# Patient Record
Sex: Male | Born: 1975 | Race: White | Hispanic: No | Marital: Married | Smoking: Never smoker
Health system: Southern US, Community
[De-identification: ages and names within clinical notes are randomized; demographics above are authoritative.]

## PROBLEM LIST (undated history)

## (undated) DIAGNOSIS — E785 Hyperlipidemia, unspecified: Secondary | ICD-10-CM

## (undated) DIAGNOSIS — I10 Essential (primary) hypertension: Secondary | ICD-10-CM

## (undated) HISTORY — DX: Hyperlipidemia, unspecified: E78.5

## (undated) HISTORY — DX: Essential (primary) hypertension: I10

## (undated) HISTORY — PX: TONSILLECTOMY: SUR1361

---

## 2011-10-20 ENCOUNTER — Ambulatory Visit (INDEPENDENT_AMBULATORY_CARE_PROVIDER_SITE_OTHER): Payer: 59 | Admitting: Family Medicine

## 2011-10-20 ENCOUNTER — Encounter: Payer: Self-pay | Admitting: Family Medicine

## 2011-10-20 VITALS — BP 125/83 | HR 80 | Temp 97.9°F | Ht 71.0 in | Wt 230.0 lb

## 2011-10-20 DIAGNOSIS — M25561 Pain in right knee: Secondary | ICD-10-CM | POA: Insufficient documentation

## 2011-10-20 DIAGNOSIS — M25569 Pain in unspecified knee: Secondary | ICD-10-CM

## 2011-10-20 NOTE — Assessment & Plan Note (Signed)
consistent with patellofemoral syndrome, less likely early onset DJD.  No evidence this is a gouty flare based on exam.  Shown home exercise program.  Tylenol, nsaids as needed.  Arch supports may be helpful - discussed OTC ones that would help.  Icing as needed.  Consider x-rays and/or formal PT if not improving as expected.  If he does have mild arthritis on radiographs a cortisone injection would be a consideration (does not typically help with patellofemoral syndrome though).  See instructions for further.

## 2011-10-20 NOTE — Progress Notes (Signed)
  Subjective:    Patient ID: Dean Riley, male    DOB: 07/08/1975, 36 y.o.   MRN: [id number]  PCP: Dr. Alberteen Sam  HPI 36 yo M here for bilateral knee pain.  Patient denies known injury. States for past 2-3 years toward end of day especially has had L > R anterior knee pain. Pain worse with prolonged sitting (then going to get up), stairs. Notices it's worse when playing basketball also. Some swelling at times. Not taking any medications or icing. No catching, locking, giving out. 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  Past Medical History  Diagnosis Date  . Hypertension   . Hyperlipidemia   . Gout   . Vitamin d deficiency     Current Outpatient Prescriptions on File Prior to Visit  Medication Sig Dispense Refill  . gemfibrozil (LOPID) 600 MG tablet Take 600 mg by mouth 2 (two) times daily.      Marland Kitchen losartan (COZAAR) 50 MG tablet Take 50 mg by mouth daily.        History reviewed. No pertinent past surgical history.  Allergies  Allergen Reactions  . Indomethacin   . Lisinopril     History   Social History  . Marital Status: Married    Spouse Name: N/A    Number of Children: N/A  . Years of Education: N/A   Occupational History  . Not on file.   Social History Main Topics  . Smoking status: Never Smoker   . Smokeless tobacco: Not on file  . Alcohol Use: Not on file  . Drug Use: Not on file  . Sexually Active: Not on file   Other Topics Concern  . Not on file   Social History Narrative  . No narrative on file    Family History  Problem Relation Age of Onset  . Diabetes Father   . Heart attack Father   . Hyperlipidemia Father   . Hypertension Father   . Sudden death Neg Hx     BP 125/83  Pulse 80  Temp(Src) 97.9 F (36.6 C) (Oral)  Ht 5\' 11"  (1.803 m)  Wt 230 lb (104.327 kg)  BMI 32.08 kg/m2  Review of Systems See HPI above.    Objective:   Physical Exam Gen: NAD  L knee: No gross deformity, ecchymoses, swelling.  Mild VMO  atrophy, patellar shift with flexion to extension. No TTP. FROM. Negative ant/post drawers. Negative valgus/varus testing. Negative lachmanns. Negative mcmurrays, apleys, patellar apprehension, clarkes. NV intact distally. Hip abduction 5/5 strength Mild pes planus.  R knee: No gross deformity, ecchymoses, swelling.  Mild VMO atrophy, patellar shift with flexion to extension. No TTP. FROM. Negative ant/post drawers. Negative valgus/varus testing. Negative lachmanns. Negative mcmurrays, apleys, patellar apprehension, clarkes. NV intact distally. Hip abduction 5/5 strength Mild pes planus.     Assessment & Plan:  1. Bilateral knee pain - consistent with patellofemoral syndrome, less likely early onset DJD.  No evidence this is a gouty flare based on exam.  Shown home exercise program.  Tylenol, nsaids as needed.  Arch supports may be helpful - discussed OTC ones that would help.  Icing as needed.  Consider x-rays and/or formal PT if not improving as expected.  If he does have mild arthritis on radiographs a cortisone injection would be a consideration (does not typically help with patellofemoral syndrome though).  See instructions for further.

## 2011-10-20 NOTE — Patient Instructions (Signed)
Your history and exam are most consistent with patellofemoral syndrome. Avoid painful activities (especially squats and lunges, plyometrics, increasing running mileage) as much as possible. Cross train with swimming, cycling with low resistance, elliptical Straight leg raise, hip abduction, hip adduction/VMO exercises at least once daily - 3 sets of 15 - can add ankle weight if these become too easy. Consider formal physical therapy Correct foot breakdown with OTC orthotics. Icing 15 minutes at a time 3-4 times a day Tylenol (500mg  1-2 tabs three times a day) or aleve (2 tabs twice a day with food) as needed for pain If not improving as expected after 6 weeks, come back to see me (or call and I can put in order for x-rays).

## 2011-11-06 ENCOUNTER — Encounter: Payer: Self-pay | Admitting: Internal Medicine

## 2011-11-06 ENCOUNTER — Ambulatory Visit (INDEPENDENT_AMBULATORY_CARE_PROVIDER_SITE_OTHER): Payer: 59 | Admitting: Internal Medicine

## 2011-11-06 VITALS — BP 108/82 | HR 87 | Temp 98.1°F | Resp 18 | Ht 70.5 in | Wt 229.2 lb

## 2011-11-06 DIAGNOSIS — M25569 Pain in unspecified knee: Secondary | ICD-10-CM

## 2011-11-06 DIAGNOSIS — E785 Hyperlipidemia, unspecified: Secondary | ICD-10-CM

## 2011-11-06 DIAGNOSIS — M25562 Pain in left knee: Secondary | ICD-10-CM

## 2011-11-06 DIAGNOSIS — I10 Essential (primary) hypertension: Secondary | ICD-10-CM

## 2011-11-06 DIAGNOSIS — R454 Irritability and anger: Secondary | ICD-10-CM

## 2011-11-06 DIAGNOSIS — R4589 Other symptoms and signs involving emotional state: Secondary | ICD-10-CM

## 2011-11-06 MED ORDER — FENOFIBRATE 145 MG PO TABS: 145.0000 mg | ORAL_TABLET | Freq: Every day | ORAL | Status: DC

## 2011-11-06 MED ORDER — DICLOFENAC SODIUM 75 MG PO TBEC: 75.0000 mg | DELAYED_RELEASE_TABLET | Freq: Two times a day (BID) | ORAL | Status: AC | PRN

## 2011-11-06 MED ORDER — SCOPOLAMINE 1 MG/3DAYS TD PT72: 1.0000 | MEDICATED_PATCH | TRANSDERMAL | Status: AC

## 2011-11-06 MED ORDER — LOSARTAN POTASSIUM 50 MG PO TABS: 50.0000 mg | ORAL_TABLET | Freq: Every day | ORAL | Status: DC

## 2011-11-06 NOTE — Patient Instructions (Signed)
Please schedule fasting labs in 6wks- lipid/lft 272.4, uric acid- gout, cbc-leukocytosis

## 2011-11-12 DIAGNOSIS — I10 Essential (primary) hypertension: Secondary | ICD-10-CM | POA: Insufficient documentation

## 2011-11-12 DIAGNOSIS — E785 Hyperlipidemia, unspecified: Secondary | ICD-10-CM | POA: Insufficient documentation

## 2011-11-12 DIAGNOSIS — R454 Irritability and anger: Secondary | ICD-10-CM | POA: Insufficient documentation

## 2011-11-12 NOTE — Progress Notes (Signed)
  Subjective:    Patient ID: Dean Riley, male    DOB: 1975/11/19, 36 y.o.   MRN: [id number]  HPI Pt presents to clinic for evaluation of multiple medical problems. Reviewed recent outside chol with tg under suboptimal control. Has difficulty with lopid bid dosing. Notes quick temper but no repercusions. Planning on taking a cruise in near future and requests sea sickness medication. Has chronic intermittent knee pain.   Past Medical History  Diagnosis Date  . Hypertension   . Hyperlipidemia   . Gout   . Vitamin d deficiency    No past surgical history on file.  reports that he has never smoked. He does not have any smokeless tobacco history on file. His alcohol and drug histories not on file. family history includes Diabetes in his father; Heart attack in his father; Hyperlipidemia in his father; and Hypertension in his father.  There is no history of Sudden death. Allergies  Allergen Reactions  . Indomethacin   . Lisinopril      Review of Systems  Musculoskeletal: Positive for arthralgias.  Psychiatric/Behavioral: Negative for behavioral problems and agitation.  All other systems reviewed and are negative.       Objective:   Physical Exam  Nursing note and vitals reviewed. Constitutional: He appears well-developed and well-nourished. No distress.  HENT:  Head: Normocephalic and atraumatic.  Eyes: Conjunctivae are normal. No scleral icterus.  Neck: Neck supple.  Cardiovascular: Normal rate, regular rhythm and normal heart sounds.  Exam reveals no gallop and no friction rub.   No murmur heard. Pulmonary/Chest: Effort normal and breath sounds normal. No respiratory distress. He has no wheezes. He has no rales.  Neurological: He is alert.  Skin: Skin is warm and dry. He is not diaphoretic.  Psychiatric: He has a normal mood and affect.          Assessment & Plan:

## 2011-11-12 NOTE — Assessment & Plan Note (Signed)
Normotensive and stable. Continue current regimen. Monitor bp as outpt and followup in clinic as scheduled.  

## 2011-11-12 NOTE — Assessment & Plan Note (Signed)
Will attempt regular exercise. Discussed counseling. States will call if wishes to proceed

## 2011-11-12 NOTE — Assessment & Plan Note (Signed)
Attempt voltaren prn with food and no other nsaids.

## 2011-11-12 NOTE — Assessment & Plan Note (Signed)
Change lopid to fenofibrate. Obtain lipid/lft 6wks

## 2011-11-23 ENCOUNTER — Encounter: Payer: Self-pay | Admitting: Internal Medicine

## 2011-12-12 ENCOUNTER — Telehealth: Payer: Self-pay | Admitting: Internal Medicine

## 2011-12-12 DIAGNOSIS — E785 Hyperlipidemia, unspecified: Secondary | ICD-10-CM

## 2011-12-12 DIAGNOSIS — M109 Gout, unspecified: Secondary | ICD-10-CM

## 2011-12-12 DIAGNOSIS — D72829 Elevated white blood cell count, unspecified: Secondary | ICD-10-CM

## 2011-12-12 NOTE — Telephone Encounter (Signed)
Allopurinol Last OV 11-06-11, not on med list.Please advise    Lab orders placed

## 2011-12-12 NOTE — Telephone Encounter (Signed)
Please schedule fasting labs for July --- lipid/lft 272.4, uric acid- gout, cbc-leukocytosis. Patients wife states that he will be going to the [facility] lab.

## 2011-12-12 NOTE — Telephone Encounter (Signed)
Patients wife Lynnea Ferrier called back stating that patient will be leaving for vacation this Friday and will do labs when he returns from vacation.  Also, Lynnea Ferrier states that patient is almost out of allopurinol and would like a refill.(received fax from [facility]).   --Allopurinol 100mg  tab. Take one tablet by mouth every day for two weeks then two tablets daily for one week then three tabs daily for one week. Qty 49 last fill 5.20.13. Dr. Alberteen Sam previously wrote rx.   Call Kerri(patients wife) if we have any questions. She works for Dr. Alberteen Sam-- [phone]

## 2011-12-13 NOTE — Telephone Encounter (Signed)
Ok to fill it for 3 months but need to confirm dose since we don't have it on med list. prob 300mg  qd

## 2011-12-14 MED ORDER — ALLOPURINOL 100 MG PO TABS: 200.0000 mg | ORAL_TABLET | Freq: Every day | ORAL | Status: DC

## 2011-12-14 NOTE — Telephone Encounter (Signed)
Spoke with pt's wife and verified that pt currently takes 200mg  daily and symptoms seem to be under control. Advised her refill will be sent for 200mg .

## 2011-12-18 ENCOUNTER — Encounter: Payer: Self-pay | Admitting: Internal Medicine

## 2012-01-15 LAB — CBC
HCT: 46 % (ref 39.0–52.0)
MCH: 29.8 pg (ref 26.0–34.0)
MCHC: 33.9 g/dL (ref 30.0–36.0)
RDW: 14.1 % (ref 11.5–15.5)

## 2012-01-15 LAB — LIPID PANEL
Cholesterol: 204 mg/dL — ABNORMAL HIGH (ref 0–200)
HDL: 35 mg/dL — ABNORMAL LOW (ref >39)
Total CHOL/HDL Ratio: 5.8 Ratio

## 2012-01-15 NOTE — Addendum Note (Signed)
Addended byDuaine Dredge, Emmilyn Crooke L on: 01/15/2012 10:09 AM   Modules accepted: Orders

## 2012-01-16 LAB — HEPATIC FUNCTION PANEL
ALT: 58 U/L — ABNORMAL HIGH (ref 0–53)
AST: 27 U/L (ref 0–37)
Albumin: 4.7 g/dL (ref 3.5–5.2)
Alkaline Phosphatase: 72 U/L (ref 39–117)
Bilirubin, Direct: 0.1 mg/dL (ref 0.0–0.3)

## 2012-01-16 LAB — URIC ACID: Uric Acid, Serum: 5.5 mg/dL (ref 4.0–7.8)

## 2012-02-06 ENCOUNTER — Ambulatory Visit: Payer: 59 | Admitting: Internal Medicine

## 2012-02-12 ENCOUNTER — Encounter: Payer: Self-pay | Admitting: Internal Medicine

## 2012-02-12 ENCOUNTER — Telehealth: Payer: Self-pay | Admitting: *Deleted

## 2012-02-12 ENCOUNTER — Ambulatory Visit (INDEPENDENT_AMBULATORY_CARE_PROVIDER_SITE_OTHER): Payer: 59 | Admitting: Internal Medicine

## 2012-02-12 VITALS — BP 98/68 | HR 70 | Temp 98.4°F | Resp 16 | Wt 229.8 lb

## 2012-02-12 DIAGNOSIS — R7989 Other specified abnormal findings of blood chemistry: Secondary | ICD-10-CM

## 2012-02-12 DIAGNOSIS — I10 Essential (primary) hypertension: Secondary | ICD-10-CM

## 2012-02-12 DIAGNOSIS — Z79899 Other long term (current) drug therapy: Secondary | ICD-10-CM

## 2012-02-12 DIAGNOSIS — M109 Gout, unspecified: Secondary | ICD-10-CM

## 2012-02-12 DIAGNOSIS — R945 Abnormal results of liver function studies: Secondary | ICD-10-CM

## 2012-02-12 DIAGNOSIS — E785 Hyperlipidemia, unspecified: Secondary | ICD-10-CM

## 2012-02-12 DIAGNOSIS — E559 Vitamin D deficiency, unspecified: Secondary | ICD-10-CM

## 2012-02-12 MED ORDER — ALLOPURINOL 300 MG PO TABS: 300.0000 mg | ORAL_TABLET | Freq: Every day | ORAL | Status: DC

## 2012-02-12 NOTE — Telephone Encounter (Signed)
Please schedule nonfasting labs in ~6weeks (uric acid-gout, lft-abn lft, vit d-vit d deficiency)  Also please schedule fasting labs prior to next appointment lipid/lft-272.4 and chem7-v58.69

## 2012-02-12 NOTE — Patient Instructions (Signed)
Please schedule nonfasting labs in ~6weeks (uric acid-gout, lft-abn lft, vit d-vit d deficiency)  Also please schedule fasting labs prior to next appointment lipid/lft-272.4 and chem7-v58.69   

## 2012-02-22 DIAGNOSIS — M109 Gout, unspecified: Secondary | ICD-10-CM | POA: Insufficient documentation

## 2012-02-22 DIAGNOSIS — R945 Abnormal results of liver function studies: Secondary | ICD-10-CM | POA: Insufficient documentation

## 2012-02-22 DIAGNOSIS — R7989 Other specified abnormal findings of blood chemistry: Secondary | ICD-10-CM | POA: Insufficient documentation

## 2012-02-22 NOTE — Assessment & Plan Note (Signed)
Stable. Continue fenofibrate. Recommend low-fat diet exercise and weight loss.

## 2012-02-22 NOTE — Assessment & Plan Note (Signed)
Low normal. Asymptomatic. recommend outpatient blood pressure log to be reviewed.

## 2012-02-22 NOTE — Assessment & Plan Note (Signed)
Asymptomatic. Obtain liver function tests prior to next visit.

## 2012-02-22 NOTE — Assessment & Plan Note (Signed)
Off allopurinol. Obtain uric acid level prior to next visit

## 2012-02-22 NOTE — Progress Notes (Signed)
  Subjective:    Patient ID: Dean Riley, male    DOB: Mar 18, 1976, 36 y.o.   MRN: [id number]  HPI Pt presents to clinic for followup of multiple medical problems. Blood pressure low normal without dizziness or syncope. Compliant with medication without adverse effect. Off allopurinol with no recent gout attack. Tolerating fenofibrate. Reviewed mildly elevated triglycerides and depressed HDL. Has minimally elevated ALT.  Past Medical History  Diagnosis Date  . Hypertension   . Hyperlipidemia   . Gout   . Vitamin d deficiency    No past surgical history on file.  reports that he has never smoked. He does not have any smokeless tobacco history on file. His alcohol and drug histories not on file. family history includes Diabetes in his father; Heart attack in his father; Hyperlipidemia in his father; and Hypertension in his father.  There is no history of Sudden death. Allergies  Allergen Reactions  . Indomethacin   . Lisinopril       Review of Systems see hpi     Objective:   Physical Exam  Physical Exam  Nursing note and vitals reviewed. Constitutional: Appears well-developed and well-nourished. No distress.  HENT:  Head: Normocephalic and atraumatic.  Right Ear: External ear normal.  Left Ear: External ear normal.  Eyes: Conjunctivae are normal. No scleral icterus.  Neck: Neck supple. Carotid bruit is not present.  Cardiovascular: Normal rate, regular rhythm and normal heart sounds.  Exam reveals no gallop and no friction rub.   No murmur heard. Pulmonary/Chest: Effort normal and breath sounds normal. No respiratory distress. He has no wheezes. no rales.  Lymphadenopathy:    He has no cervical adenopathy.  Neurological:Alert.  Skin: Skin is warm and dry. Not diaphoretic.  Psychiatric: Has a normal mood and affect.        Assessment & Plan:

## 2012-03-20 ENCOUNTER — Telehealth: Payer: Self-pay | Admitting: Internal Medicine

## 2012-03-20 NOTE — Telephone Encounter (Signed)
Patients wife Lynnea Ferrier walked in stating that patient called her stating that his blood pressure was 160/90(11:22 am). BP was taken two hours after his "temper flare-up". Patient would like to know if he should take 1/2 bp pill whenever his blood pressure goes up?   Best # to call is   Wife, Lynnea Ferrier [phone]

## 2012-03-20 NOTE — Telephone Encounter (Signed)
Per Vo TWH: Caller informed that we should only be concerned about treating BP if it remains elevated on a consistent basis and/or pt begins to present w/HTN symptoms [lightheaded, dizzy, blurred vision, SOB, weakness, pain in chest and/or arm]; informed caller to have pt focus on calming methods for anxiety and/or "temper flare-ups", whatever is effective for him [reading, praying, walking, music-everyone is different] when these times arise. Caller understood & agreed/SLS

## 2012-06-13 ENCOUNTER — Ambulatory Visit (INDEPENDENT_AMBULATORY_CARE_PROVIDER_SITE_OTHER): Payer: 59 | Admitting: Internal Medicine

## 2012-06-13 ENCOUNTER — Encounter: Payer: Self-pay | Admitting: Internal Medicine

## 2012-06-13 VITALS — BP 108/74 | HR 82 | Temp 98.2°F | Resp 16 | Wt 233.0 lb

## 2012-06-13 DIAGNOSIS — I10 Essential (primary) hypertension: Secondary | ICD-10-CM

## 2012-06-13 DIAGNOSIS — E559 Vitamin D deficiency, unspecified: Secondary | ICD-10-CM

## 2012-06-13 DIAGNOSIS — E785 Hyperlipidemia, unspecified: Secondary | ICD-10-CM

## 2012-06-13 DIAGNOSIS — M109 Gout, unspecified: Secondary | ICD-10-CM

## 2012-06-13 NOTE — Telephone Encounter (Signed)
Orders placed; pt here for OV, no prior labs done/SLS

## 2012-06-14 LAB — LIPID PANEL
HDL: 32 mg/dL — ABNORMAL LOW (ref >39)
LDL Cholesterol: 128 mg/dL — ABNORMAL HIGH (ref 0–99)
Total CHOL/HDL Ratio: 7 Ratio

## 2012-06-14 LAB — URIC ACID: Uric Acid, Serum: 8.1 mg/dL — ABNORMAL HIGH (ref 4.0–7.8)

## 2012-06-14 LAB — HEPATIC FUNCTION PANEL
Bilirubin, Direct: 0.2 mg/dL (ref 0.0–0.3)
Indirect Bilirubin: 1 mg/dL — ABNORMAL HIGH (ref 0.0–0.9)
Total Bilirubin: 1.2 mg/dL (ref 0.3–1.2)

## 2012-06-14 LAB — BASIC METABOLIC PANEL
Calcium: 9.7 mg/dL (ref 8.4–10.5)
Potassium: 4.8 mEq/L (ref 3.5–5.3)
Sodium: 140 mEq/L (ref 135–145)

## 2012-06-14 NOTE — Addendum Note (Signed)
Addended by: Regis Bill on: 06/14/2012 08:28 AM   Modules accepted: Orders

## 2012-06-14 NOTE — Telephone Encounter (Signed)
Lab orders released/SLS 

## 2012-06-15 LAB — VITAMIN D 25 HYDROXY (VIT D DEFICIENCY, FRACTURES): Vit D, 25-Hydroxy: 33 ng/mL (ref 30–89)

## 2012-06-16 DIAGNOSIS — E559 Vitamin D deficiency, unspecified: Secondary | ICD-10-CM | POA: Insufficient documentation

## 2012-06-16 NOTE — Assessment & Plan Note (Signed)
Obtain vit d level 

## 2012-06-16 NOTE — Progress Notes (Signed)
  Subjective:    Patient ID: Dean Riley, male    DOB: Aug 02, 1975, 37 y.o.   MRN: [id number]  HPI Pt presents to clinic for followup of multiple medical problems. No recent gout attack. Tolerating medication without adverse effect. Received flu vaccine for the season. No active complaint.  Past Medical History  Diagnosis Date  . Hypertension   . Hyperlipidemia   . Gout   . Vitamin D deficiency    No past surgical history on file.  reports that he has never smoked. He does not have any smokeless tobacco history on file. His alcohol and drug histories not on file. family history includes Diabetes in his father; Heart attack in his father; Hyperlipidemia in his father; and Hypertension in his father.  There is no history of Sudden death. Allergies  Allergen Reactions  . Indomethacin   . Lisinopril       Review of Systems see hpi     Objective:   Physical Exam  Nursing note and vitals reviewed. Constitutional: He appears well-developed and well-nourished. No distress.  Skin: He is not diaphoretic.          Assessment & Plan:

## 2012-06-16 NOTE — Assessment & Plan Note (Signed)
Obtain uric acid

## 2012-06-16 NOTE — Assessment & Plan Note (Signed)
Normotensive and stable. Continue current regimen. Monitor bp as outpt and followup in clinic as scheduled.  

## 2012-06-16 NOTE — Assessment & Plan Note (Signed)
Obtain lipid/lft. 

## 2012-06-21 ENCOUNTER — Other Ambulatory Visit: Payer: Self-pay | Admitting: Internal Medicine

## 2012-06-21 ENCOUNTER — Telehealth: Payer: Self-pay | Admitting: *Deleted

## 2012-06-21 DIAGNOSIS — R945 Abnormal results of liver function studies: Secondary | ICD-10-CM

## 2012-06-21 NOTE — Telephone Encounter (Signed)
Message copied by Regis Bill on Fri Jun 21, 2012  5:14 PM ------      Message from: Edwyna Perfect      Created: Thu Jun 20, 2012  9:35 PM       Chol and TG are high. Low fat diet , exercise and wt loss. Liver test are elevated. Recommend scheduling abd [place] dx-abn lft and repeat lft in one month dx abn lft

## 2012-06-21 NOTE — Telephone Encounter (Signed)
Patient informed, understood & agreed; future lab order placed for LFT/SLS Ok per patient to order Abdominal US. Thanks.

## 2012-07-05 ENCOUNTER — Other Ambulatory Visit (HOSPITAL_BASED_OUTPATIENT_CLINIC_OR_DEPARTMENT_OTHER): Payer: 59

## 2012-07-10 ENCOUNTER — Other Ambulatory Visit (HOSPITAL_BASED_OUTPATIENT_CLINIC_OR_DEPARTMENT_OTHER): Payer: Self-pay | Admitting: Family Medicine

## 2012-07-10 ENCOUNTER — Ambulatory Visit (HOSPITAL_BASED_OUTPATIENT_CLINIC_OR_DEPARTMENT_OTHER)
Admission: RE | Admit: 2012-07-10 | Discharge: 2012-07-10 | Disposition: A | Discharge status: Home / Self Care | Payer: 59 | Source: Ambulatory Visit | Attending: Family Medicine | Admitting: Family Medicine

## 2012-07-10 DIAGNOSIS — R1084 Generalized abdominal pain: Secondary | ICD-10-CM

## 2012-07-10 DIAGNOSIS — R109 Unspecified abdominal pain: Secondary | ICD-10-CM | POA: Insufficient documentation

## 2012-07-10 LAB — CBC WITH DIFFERENTIAL/PLATELET
Basophils Absolute: 0 10*3/uL (ref 0.0–0.1)
Basophils Relative: 0 % (ref 0–1)
Eosinophils Absolute: 0.2 10*3/uL (ref 0.0–0.7)
Eosinophils Relative: 3 % (ref 0–5)
HCT: 46.6 % (ref 39.0–52.0)
Hemoglobin: 16 g/dL (ref 13.0–17.0)
Lymphocytes Relative: 30 % (ref 12–46)
Lymphs Abs: 2.4 10*3/uL (ref 0.7–4.0)
MCH: 30.3 pg (ref 26.0–34.0)
MCHC: 34.3 g/dL (ref 30.0–36.0)
MCV: 88.3 fL (ref 78.0–100.0)
Monocytes Absolute: 0.9 10*3/uL (ref 0.1–1.0)
Monocytes Relative: 11 % (ref 3–12)
Neutro Abs: 4.6 10*3/uL (ref 1.7–7.7)
Neutrophils Relative %: 56 % (ref 43–77)
Platelets: 264 10*3/uL (ref 150–400)
RBC: 5.28 MIL/uL (ref 4.22–5.81)
RDW: 13.1 % (ref 11.5–15.5)
WBC: 8.1 10*3/uL (ref 4.0–10.5)

## 2012-07-22 ENCOUNTER — Other Ambulatory Visit: Payer: Self-pay | Admitting: Internal Medicine

## 2012-10-11 ENCOUNTER — Ambulatory Visit: Payer: 59 | Admitting: Internal Medicine

## 2012-11-22 ENCOUNTER — Other Ambulatory Visit: Payer: 59

## 2012-11-22 LAB — COMPLETE METABOLIC PANEL WITH GFR
ALT: 45 U/L (ref 0–53)
AST: 25 U/L (ref 0–37)
Albumin: 4.4 g/dL (ref 3.5–5.2)
Alkaline Phosphatase: 66 U/L (ref 39–117)
BUN: 14 mg/dL (ref 6–23)
CO2: 27 mEq/L (ref 19–32)
Calcium: 9.7 mg/dL (ref 8.4–10.5)
Chloride: 107 mEq/L (ref 96–112)
Creat: 1.32 mg/dL (ref 0.50–1.35)
GFR, Est African American: 80 mL/min
GFR, Est Non African American: 69 mL/min
Glucose, Bld: 91 mg/dL (ref 70–99)
Potassium: 4.9 mEq/L (ref 3.5–5.3)
Sodium: 143 mEq/L (ref 135–145)
Total Bilirubin: 0.8 mg/dL (ref 0.3–1.2)
Total Protein: 7 g/dL (ref 6.0–8.3)

## 2012-11-22 LAB — LIPID PANEL
Cholesterol: 221 mg/dL — ABNORMAL HIGH (ref 0–200)
HDL: 38 mg/dL — ABNORMAL LOW (ref >39)
LDL Cholesterol: 137 mg/dL — ABNORMAL HIGH (ref 0–99)
Total CHOL/HDL Ratio: 5.8 Ratio
Triglycerides: 231 mg/dL — ABNORMAL HIGH (ref <150)
VLDL: 46 mg/dL — ABNORMAL HIGH (ref 0–40)

## 2012-11-22 LAB — CBC WITH DIFFERENTIAL/PLATELET
Basophils Absolute: 0 10*3/uL (ref 0.0–0.1)
Basophils Relative: 1 % (ref 0–1)
Eosinophils Absolute: 0.2 10*3/uL (ref 0.0–0.7)
Eosinophils Relative: 3 % (ref 0–5)
HCT: 44.6 % (ref 39.0–52.0)
Hemoglobin: 15.5 g/dL (ref 13.0–17.0)
Lymphocytes Relative: 27 % (ref 12–46)
Lymphs Abs: 1.8 10*3/uL (ref 0.7–4.0)
MCH: 29.9 pg (ref 26.0–34.0)
MCHC: 34.8 g/dL (ref 30.0–36.0)
MCV: 85.9 fL (ref 78.0–100.0)
Monocytes Absolute: 0.5 10*3/uL (ref 0.1–1.0)
Monocytes Relative: 8 % (ref 3–12)
Neutro Abs: 4 10*3/uL (ref 1.7–7.7)
Neutrophils Relative %: 61 % (ref 43–77)
Platelets: 270 10*3/uL (ref 150–400)
RBC: 5.19 MIL/uL (ref 4.22–5.81)
RDW: 12.8 % (ref 11.5–15.5)
WBC: 6.5 10*3/uL (ref 4.0–10.5)

## 2012-11-22 LAB — TSH: TSH: 0.957 u[IU]/mL (ref 0.350–4.500)

## 2012-12-02 ENCOUNTER — Ambulatory Visit: Payer: 59 | Admitting: Family Medicine

## 2012-12-05 ENCOUNTER — Ambulatory Visit (HOSPITAL_BASED_OUTPATIENT_CLINIC_OR_DEPARTMENT_OTHER)
Admission: RE | Admit: 2012-12-05 | Discharge: 2012-12-05 | Disposition: A | Discharge status: Home / Self Care | Payer: 59 | Source: Ambulatory Visit | Attending: Family Medicine | Admitting: Family Medicine

## 2012-12-05 ENCOUNTER — Ambulatory Visit: Payer: 59 | Admitting: Family Medicine

## 2012-12-05 ENCOUNTER — Encounter: Payer: Self-pay | Admitting: Family Medicine

## 2012-12-05 ENCOUNTER — Ambulatory Visit (INDEPENDENT_AMBULATORY_CARE_PROVIDER_SITE_OTHER): Payer: 59 | Admitting: Family Medicine

## 2012-12-05 VITALS — BP 127/84 | HR 93 | Wt 236.0 lb

## 2012-12-05 DIAGNOSIS — R079 Chest pain, unspecified: Secondary | ICD-10-CM | POA: Insufficient documentation

## 2012-12-05 DIAGNOSIS — R0781 Pleurodynia: Secondary | ICD-10-CM

## 2012-12-05 DIAGNOSIS — W19XXXA Unspecified fall, initial encounter: Secondary | ICD-10-CM | POA: Insufficient documentation

## 2012-12-05 DIAGNOSIS — R0602 Shortness of breath: Secondary | ICD-10-CM | POA: Insufficient documentation

## 2012-12-05 MED ORDER — TRAMADOL HCL 50 MG PO TABS: 50.0000 mg | ORAL_TABLET | Freq: Four times a day (QID) | ORAL | Status: DC | PRN

## 2012-12-05 MED ORDER — CYCLOBENZAPRINE HCL 10 MG PO TABS: 10.0000 mg | ORAL_TABLET | Freq: Three times a day (TID) | ORAL | Status: DC | PRN

## 2012-12-05 MED ORDER — ACETAMINOPHEN-CODEINE 300-30 MG PO TABS: 1.0000 | ORAL_TABLET | Freq: Four times a day (QID) | ORAL | Status: DC | PRN

## 2012-12-05 NOTE — Patient Instructions (Addendum)
1)  Rib/Back/Flank Pain -  Tramadol - Start with 1 tab at night and you can increase slowly to 4 per day if needed for pain.    Tylenol w/Codeine - Start with 1 and increase as tolerated.  Voltaren Gel  Therma Care - Wear up to 8 hours during the day.    Muscle Relaxer - Flexeril - Take up to 3 times per day.    Rib Contusion A rib contusion (bruise) can occur by a blow to the chest or by a fall against a hard object. Usually these will be much better in a couple weeks. If X-rays were taken today and there are no broken bones (fractures), the diagnosis of bruising is made. However, broken ribs may not show up for several days, or may be discovered later on a routine X-ray when signs of healing show up. If this happens to you, it does not mean that something was missed on the X-ray, but simply that it did not show up on the first X-rays. Earlier diagnosis will not usually change the treatment. HOME CARE INSTRUCTIONS   Avoid strenuous activity. Be careful during activities and avoid bumping the injured ribs. Activities that pull on the injured ribs and cause pain should be avoided, if possible.  For the first day or two, an ice pack used every 20 minutes while awake may be helpful. Put ice in a plastic bag and put a towel between the bag and the skin.  Eat a normal, well-balanced diet. Drink plenty of fluids to avoid constipation.  Take deep breaths several times a day to keep lungs free of infection. Try to cough several times a day. Splint the injured area with a pillow while coughing to ease pain. Coughing can help prevent pneumonia.  Wear a rib belt or binder only if told to do so by your caregiver. If you are wearing a rib belt or binder, you must do the breathing exercises as directed by your caregiver. If not used properly, rib belts or binders restrict breathing which can lead to pneumonia.  Only take over-the-counter or prescription medicines for pain, discomfort, or fever as  directed by your caregiver. SEEK MEDICAL CARE IF:   You or your child has an oral temperature above 102 F (38.9 C).  Your baby is older than 3 months with a rectal temperature of 100.5 F (38.1 C) or higher for more than 1 day.  You develop a cough, with thick or bloody sputum. SEEK IMMEDIATE MEDICAL CARE IF:   You have difficulty breathing.  You feel sick to your stomach (nausea), have vomiting or belly (abdominal) pain.  You have worsening pain, not controlled with medications, or there is a change in the location of the pain.  You develop sweating or radiation of the pain into the arms, jaw or shoulders, or become light headed or faint.  You or your child has an oral temperature above 102 F (38.9 C), not controlled by medicine.  Your or your baby is older than 3 months with a rectal temperature of 102 F (38.9 C) or higher.  Your baby is 44 months old or younger with a rectal temperature of 100.4 F (38 C) or higher. MAKE SURE YOU:   Understand these instructions.  Will watch your condition.  Will get help right away if you are not doing well or get worse. Document Released: 02/14/2001 Document Revised: 08/14/2011 Document Reviewed: 01/08/2008 [facility] Patient Information 2014 [place], [place].

## 2012-12-05 NOTE — Progress Notes (Signed)
  Subjective:    Patient ID: Dean Riley, male    DOB: [birth year], 37 y.o.   MRN: [id number]  HPI  Dean Riley is here today complaining of right sided back pain. He fell and hurt himself about 3 days ago. He describes his pain as severe and feels that it is worsening. He has taken Ibuprofen for the pain which has not helped very much.    Review of Systems  Constitutional: Negative for fever and fatigue.  Respiratory: Negative for shortness of breath.   Cardiovascular: Negative for chest pain.  Musculoskeletal: Positive for back pain.    Past Medical History  Diagnosis Date  . Hypertension   . Hyperlipidemia   . Gout   . Vitamin D deficiency     Family History  Problem Relation Age of Onset  . Diabetes Father   . Heart attack Father   . Hyperlipidemia Father   . Hypertension Father   . Sudden death Neg Hx     History   Social History Narrative   Marital Status: Married Scientific laboratory technician)   Children:  [name], Freer, [name] and [name]   Pets: None   Living Situation: Lives with wife, [name] and [name]   Occupation: Merchandiser, retail at [employer]   Education: GED   Tobacco Use/Exposure:  None    Alcohol Use:  Occasional   Drug Use:  None   Diet:  Regular   Exercise:  None       Objective:   Physical Exam  Constitutional: He appears well-nourished. No distress.  Cardiovascular: Normal rate and regular rhythm.   Pulmonary/Chest: Effort normal and breath sounds normal.  Musculoskeletal: He exhibits tenderness.  Skin: No rash noted.          Assessment & Plan:

## 2012-12-10 ENCOUNTER — Ambulatory Visit: Payer: 59 | Admitting: Family Medicine

## 2012-12-16 ENCOUNTER — Other Ambulatory Visit: Payer: Self-pay | Admitting: Family Medicine

## 2012-12-27 ENCOUNTER — Ambulatory Visit (INDEPENDENT_AMBULATORY_CARE_PROVIDER_SITE_OTHER): Payer: 59 | Admitting: Family Medicine

## 2012-12-27 ENCOUNTER — Encounter: Payer: Self-pay | Admitting: Family Medicine

## 2012-12-27 VITALS — BP 122/77 | HR 105 | Wt 233.0 lb

## 2012-12-27 DIAGNOSIS — I10 Essential (primary) hypertension: Secondary | ICD-10-CM

## 2012-12-27 DIAGNOSIS — R7989 Other specified abnormal findings of blood chemistry: Secondary | ICD-10-CM

## 2012-12-27 DIAGNOSIS — E785 Hyperlipidemia, unspecified: Secondary | ICD-10-CM

## 2012-12-27 DIAGNOSIS — M109 Gout, unspecified: Secondary | ICD-10-CM

## 2012-12-27 MED ORDER — FENOFIBRATE 145 MG PO TABS: 145.0000 mg | ORAL_TABLET | Freq: Every day | ORAL | Status: AC

## 2012-12-27 MED ORDER — LOSARTAN POTASSIUM 50 MG PO TABS: 50.0000 mg | ORAL_TABLET | Freq: Every day | ORAL | Status: AC

## 2012-12-27 NOTE — Patient Instructions (Addendum)
1)  HTN - Continue on your current medication.  2)  Hyperlipidemia - Continue on your current medication.  Continue to work on [employer] and exercise.   3)  Fatty Liver/Elevated LFTs - Continue on your Vitamin E.      Fatty Liver Fatty liver is the accumulation of fat in liver cells. It is also called hepatosteatosis or steatohepatitis. It is normal for your liver to contain some fat. If fat is more than 5 to 10% of your liver's weight, you have fatty liver.  There are often no symptoms (problems) for years while damage is still occurring. People often learn about their fatty liver when they have medical tests for other reasons. Fat can damage your liver for years or even decades without causing problems. When it becomes severe, it can cause fatigue, weight loss, weakness, and confusion. This makes you more likely to develop more serious liver problems. The liver is the largest organ in the body. It does a lot of work and often gives no warning signs when it is sick until late in a disease. The liver has many important jobs including:  Breaking down foods.  Storing vitamins, iron, and other minerals.  Making proteins.  Making bile for food digestion.  Breaking down many products including medications, alcohol and some poisons. CAUSES  There are a number of different conditions, medications, and poisons that can cause a fatty liver. Eating too many calories causes fat to build up in the liver. Not processing and breaking fats down normally may also cause this. Certain conditions, such as obesity, diabetes, and high triglycerides also cause this. Most fatty liver patients tend to be middle-aged and over weight.  Some causes of fatty liver are:  Alcohol over consumption.  Malnutrition.  Steroid use.  Valproic acid toxicity.  Obesity.  Cushing's syndrome.  Poisons.  Tetracycline in high dosages.  Pregnancy.  Diabetes.  Hyperlipidemia.  Rapid weight loss. Some people  develop fatty liver even having none of these conditions. SYMPTOMS  Fatty liver most often causes no problems. This is called asymptomatic.  It can be diagnosed with blood tests and also by a liver biopsy.  It is one of the most common causes of minor elevations of liver enzymes on routine blood tests.  Specialized Imaging of the liver using ultrasound, CT (computed tomography) scan, or MRI (magnetic resonance imaging) can suggest a fatty liver but a biopsy is needed to confirm it.  A biopsy involves taking a small sample of liver tissue. This is done by using a needle. It is then looked at under a microscope by a specialist. TREATMENT  It is important to treat the cause. Simple fatty liver without a medical reason may not need treatment.  Weight loss, fat restriction, and exercise in overweight patients produces inconsistent results but is worth trying.  Fatty liver due to alcohol toxicity may not improve even with stopping drinking.  Good control of diabetes may reduce fatty liver.  Lower your triglycerides through diet, medication or both.  Eat a balanced, healthy diet.  Increase your physical activity.  Get regular checkups from a liver specialist.  There are no medical or surgical treatments for a fatty liver or NASH, but improving your diet and increasing your exercise may help prevent or reverse some of the damage. PROGNOSIS  Fatty liver may cause no damage or it can lead to an inflammation of the liver. This is, called steatohepatitis. When it is linked to alcohol abuse, it is called alcoholic  steatohepatitis. It often is not linked to alcohol. It is then called nonalcoholic steatohepatitis, or NASH. Over time the liver may become scarred and hardened. This condition is called cirrhosis. Cirrhosis is serious and may lead to liver failure or cancer. NASH is one of the leading causes of cirrhosis. About 10-20% of Americans have fatty liver and a smaller 2-5% has NASH. Document  Released: 07/07/2005 Document Revised: 08/14/2011 Document Reviewed: 08/30/2005 [facility] Patient Information 2014 [place], [place].

## 2012-12-27 NOTE — Progress Notes (Signed)
  Subjective:    Patient ID: Dean Riley, male    DOB: 01/03/1976, 36 y.o.   MRN: [id number]  HPI  Dean Riley is here today to go over his lab results, get refills on his medications and discuss the conditions listed below.    1)  Hyperlipidemia: He is doing well on the Fenofibrate and fish oil. He is needing a refill on his Fenofibrate.  2)  Hypertension: His blood pressure is well controlled on the Losartan. He needs a refill on this as well.   3)  Gout: He has not had a recent gout attack. He is doing well on his currents meds. He does not need any refills on these meds.   4)  Fatty Liver/Elevated Transaminases:  He has been trying to watch his fat intake and has been taking his Vitamin E.     Review of Systems  Constitutional: Negative.   HENT: Negative.   Eyes: Negative.   Respiratory: Negative.   Cardiovascular: Negative for chest pain.  Gastrointestinal: Negative.   Endocrine: Negative.   Genitourinary: Negative.   Musculoskeletal: Positive for joint swelling.  Skin: Negative.   Allergic/Immunologic: Negative.   Neurological: Negative.  Negative for headaches.  Hematological: Negative.   Psychiatric/Behavioral: Negative.     Past Medical History  Diagnosis Date  . Hypertension   . Hyperlipidemia   . Gout   . Vitamin D deficiency     Family History  Problem Relation Age of Onset  . Diabetes Father   . Heart attack Father   . Hyperlipidemia Father   . Hypertension Father   . 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 He  appears well-nourished. No distress.  HENT:  Head: Normocephalic.  Eyes: No scleral icterus.  Neck: Neck supple. No thyromegaly present.  Cardiovascular: Normal rate, regular rhythm and normal heart sounds.  Exam reveals no gallop and no friction rub.   No murmur heard. Pulmonary/Chest: Breath sounds normal. No respiratory distress. He exhibits no tenderness.  Musculoskeletal: He exhibits no edema.  Neurological: He is alert and oriented to person, place, and time.  Skin: Skin is warm and dry. No rash noted.  Psychiatric: He has a normal mood and affect. His behavior is normal. Judgment and thought content normal.          Assessment & Plan:

## 2013-01-11 DIAGNOSIS — R0781 Pleurodynia: Secondary | ICD-10-CM | POA: Insufficient documentation

## 2013-01-11 NOTE — Assessment & Plan Note (Signed)
We checked an x-ray which was normal.  He was given x-rays for Ultram, Flexeril and Tylenol 3.

## 2013-02-08 DIAGNOSIS — I10 Essential (primary) hypertension: Secondary | ICD-10-CM | POA: Insufficient documentation

## 2013-02-08 DIAGNOSIS — R7989 Other specified abnormal findings of blood chemistry: Secondary | ICD-10-CM | POA: Insufficient documentation

## 2013-02-08 NOTE — Assessment & Plan Note (Signed)
His BP is controlled on Cozaar.

## 2013-02-08 NOTE — Assessment & Plan Note (Signed)
Refilled his Tricor.

## 2013-02-08 NOTE — Assessment & Plan Note (Signed)
His symptoms are under control.

## 2013-02-08 NOTE — Assessment & Plan Note (Signed)
His LFTs are back to WNL.

## 2013-03-24 ENCOUNTER — Encounter: Payer: Self-pay | Admitting: *Deleted

## 2013-03-24 NOTE — Telephone Encounter (Signed)
OPENED IN ERROR

## 2013-05-16 ENCOUNTER — Encounter: Payer: Self-pay | Admitting: Family Medicine

## 2013-05-16 ENCOUNTER — Ambulatory Visit (INDEPENDENT_AMBULATORY_CARE_PROVIDER_SITE_OTHER): Payer: 59 | Admitting: Family Medicine

## 2013-05-16 VITALS — BP 142/83 | HR 120 | Temp 99.1°F | Resp 16 | Wt 240.0 lb

## 2013-05-16 DIAGNOSIS — R070 Pain in throat: Secondary | ICD-10-CM

## 2013-05-16 DIAGNOSIS — H9209 Otalgia, unspecified ear: Secondary | ICD-10-CM

## 2013-05-16 DIAGNOSIS — R059 Cough, unspecified: Secondary | ICD-10-CM

## 2013-05-16 DIAGNOSIS — H9203 Otalgia, bilateral: Secondary | ICD-10-CM

## 2013-05-16 DIAGNOSIS — J069 Acute upper respiratory infection, unspecified: Secondary | ICD-10-CM

## 2013-05-16 DIAGNOSIS — R509 Fever, unspecified: Secondary | ICD-10-CM

## 2013-05-16 DIAGNOSIS — R5381 Other malaise: Secondary | ICD-10-CM

## 2013-05-16 DIAGNOSIS — R05 Cough: Secondary | ICD-10-CM

## 2013-05-16 LAB — POCT INFLUENZA A/B
Influenza A, POC: NEGATIVE
Influenza B, POC: NEGATIVE

## 2013-05-16 LAB — POCT RAPID STREP A (OFFICE): Rapid Strep A Screen: NEGATIVE

## 2013-05-16 MED ORDER — AZITHROMYCIN 500 MG PO TABS: 500.0000 mg | ORAL_TABLET | Freq: Every day | ORAL | Status: AC

## 2013-05-16 MED ORDER — BENZONATATE 200 MG PO CAPS: 200.0000 mg | ORAL_CAPSULE | Freq: Three times a day (TID) | ORAL | Status: DC | PRN

## 2013-05-16 MED ORDER — HYDROCOD POLST-CHLORPHEN POLST 10-8 MG/5ML PO LQCR: 5.0000 mL | Freq: Two times a day (BID) | ORAL | Status: AC | PRN

## 2013-05-16 NOTE — Progress Notes (Signed)
Subjective:    Patient ID: Lochlin Eppinger, male    DOB: 12/15/1975, 37 y.o.   MRN: [id number]  HPI  Demba is here today complaining of several cold symptoms. He has been sick since Monday and is getting worse. He started running a fever last night and his ears are bothering him now. He feels that he has a lot of chest congestion. He has taken Tylenol, Norel and Mucinex DM for his symptoms which have only helped with the fever.   Review of Systems  Constitutional: Positive for fever, chills and fatigue.  HENT: Positive for congestion, ear pain, rhinorrhea, sinus pressure, sneezing and sore throat.   Respiratory: Positive for cough.   Cardiovascular: Negative for chest pain.  Musculoskeletal: Positive for myalgias.  Neurological: Positive for headaches.  All other systems reviewed and are negative.    Past Medical History  Diagnosis Date  . Hypertension   . Hyperlipidemia   . Gout   . Vitamin D deficiency      No past surgical history on file.   History   Social History Narrative   Marital Status: Married Scientific laboratory technician)   Children:  [name], [place], [place] and [name]   Pets: None   Living Situation: Lives with wife, [name] and [name]   Occupation: Merchandiser, retail at [employer]   Education: GED   Tobacco Use/Exposure:  None    Alcohol Use:  Occasional   Drug Use:  None   Diet:  Regular   Exercise:  None     Family History  Problem Relation Age of Onset  . Diabetes Father   . Heart attack Father   . Hyperlipidemia Father   . Hypertension Father   . Sudden death Neg Hx      Current Outpatient Prescriptions on File Prior to Visit  Medication Sig Dispense Refill  . fenofibrate (TRICOR) 145 MG tablet Take 1 tablet (145 mg total) by mouth daily.  90 tablet  3  . fish oil-omega-3 fatty acids 1000 MG capsule Take 1 g by mouth daily.      Marland Kitchen losartan (COZAAR) 50 MG tablet Take 1 tablet (50 mg total) by mouth daily.  90 tablet  3  . MILK THISTLE-DAND-FENNEL-LICOR PO  Take 240 mg by mouth daily.      . vitamin E 400 UNIT capsule Take 400 Units by mouth daily.       No current facility-administered medications on file prior to visit.     Allergies  Allergen Reactions  . Indomethacin   . Lisinopril      Immunization History  Administered Date(s) Administered  . Influenza-Unspecified 03/19/2013        Objective:   Physical Exam  Constitutional: He is oriented to person, place, and time. He appears well-developed and well-nourished.  Eyes: Conjunctivae and EOM are normal. Pupils are equal, round, and reactive to light.  Neck: Normal range of motion. Neck supple.  Cardiovascular: Normal rate.   Abdominal: Soft. Bowel sounds are normal.  Neurological: He is alert and oriented to person, place, and time. He has normal reflexes.  Skin: Skin is warm and dry.  Psychiatric: He has a normal mood and affect. His behavior is normal. Judgment and thought content normal.  Appears ill      Assessment & Plan:    Dwayne was seen today for uri and otalgia.  Diagnoses and associated orders for this visit:  Fever, unspecified - POCT rapid strep A (Negative)  - POCT Influenza A/B (Negative)  -  azithromycin (ZITHROMAX) 500 MG tablet; Take 1 tablet (500 mg total) by mouth daily.  Cough - POCT Influenza A/B - chlorpheniramine-HYDROcodone (TUSSIONEX PENNKINETIC ER) 10-8 MG/5ML LQCR; Take 5 mLs by mouth every 12 (twelve) hours as needed. - benzonatate (TESSALON) 200 MG capsule; Take 1 capsule (200 mg total) by mouth 3 (three) times daily as needed for cough.  Other malaise and fatigue - POCT rapid strep A - POCT Influenza A/B  Throat pain - POCT rapid strep A  Acute upper respiratory infections of unspecified site - POCT Influenza A/B  Otalgia of both ears

## 2013-06-09 DIAGNOSIS — J069 Acute upper respiratory infection, unspecified: Secondary | ICD-10-CM | POA: Insufficient documentation

## 2013-06-09 DIAGNOSIS — H9203 Otalgia, bilateral: Secondary | ICD-10-CM | POA: Insufficient documentation

## 2013-06-14 IMAGING — CR DG ABDOMEN 1V
1 series · 1 of 1 positions shown · non-contrast
Comparison: None.

CLINICAL DATA: Abdominal pain

ABDOMEN - 1 VIEW

[t abdomen supine]
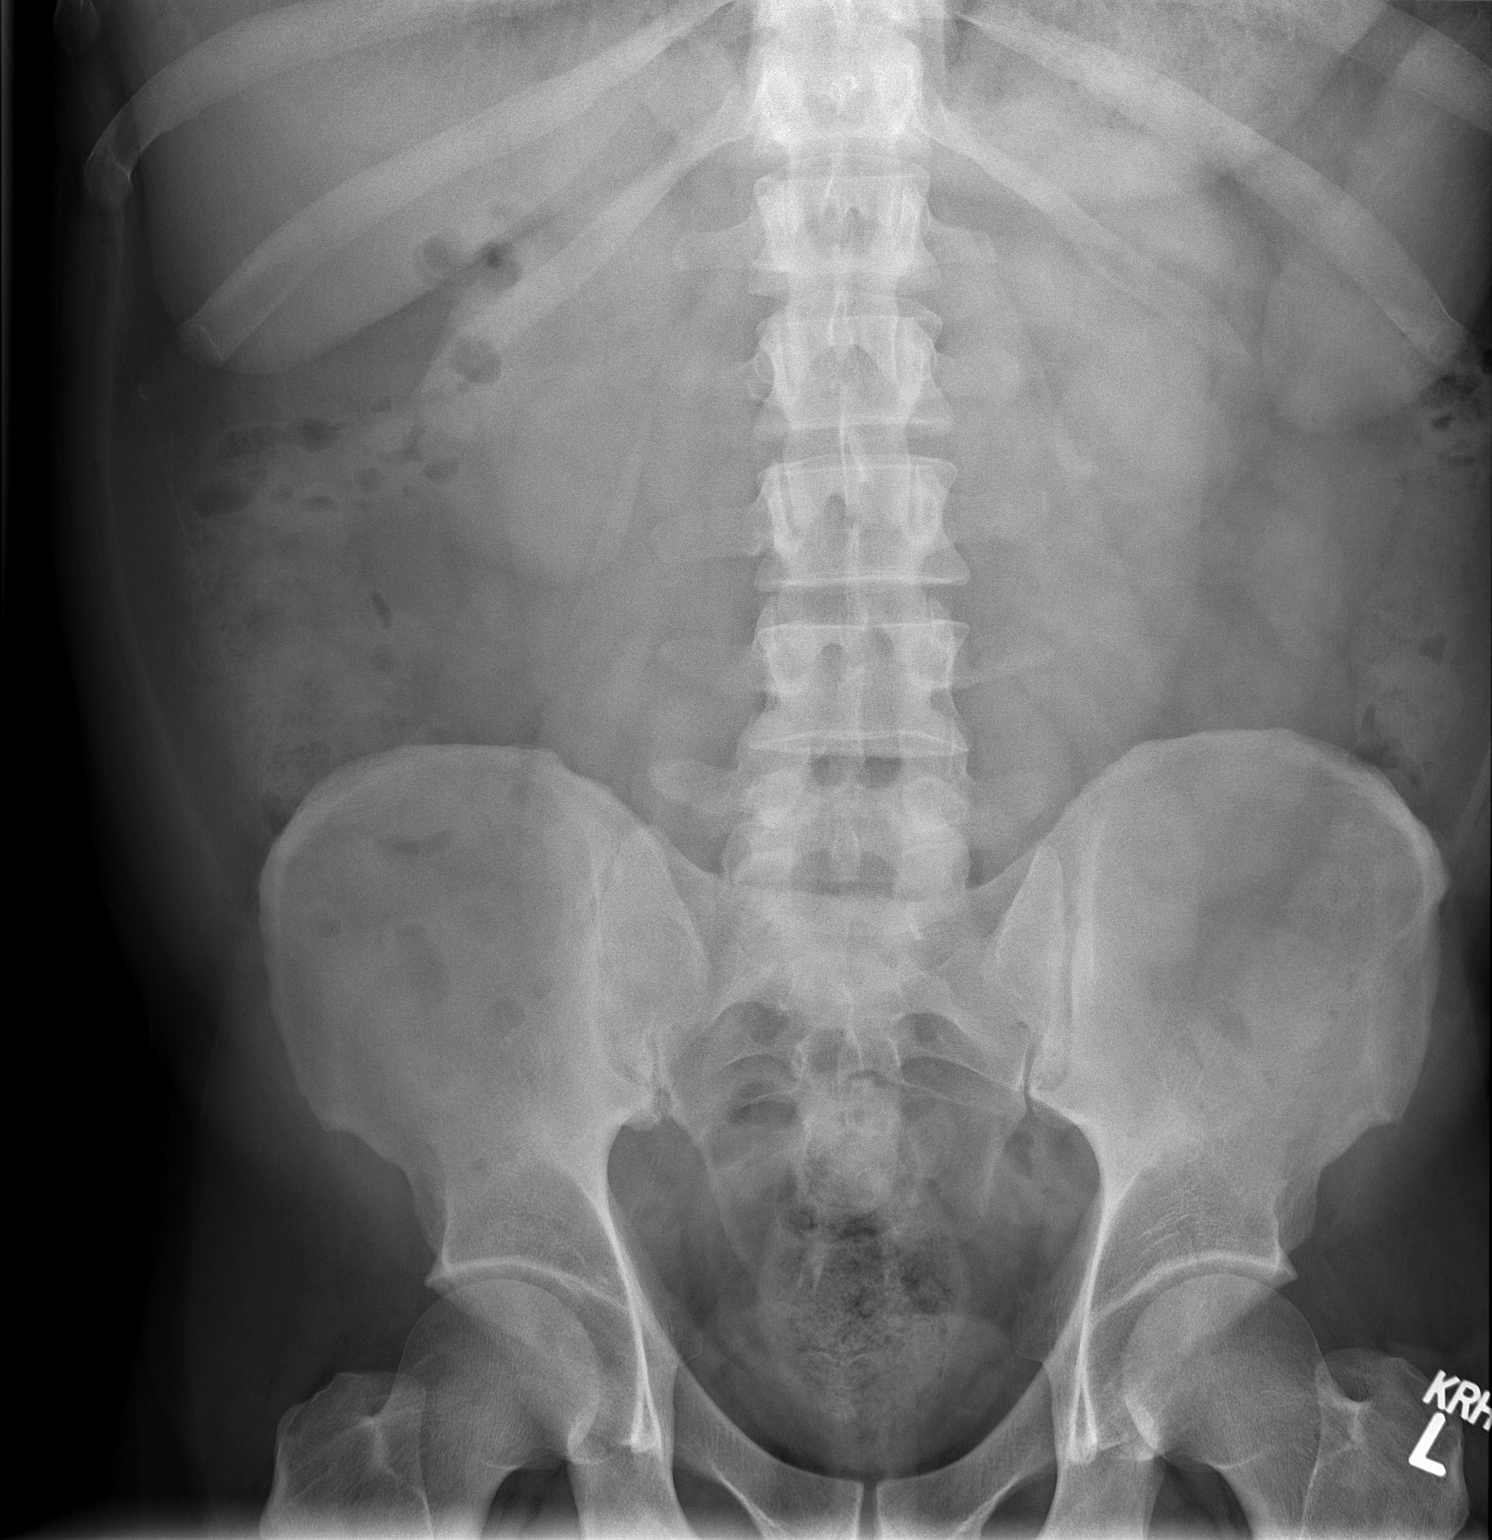

[1 of 1 positions shown; findings below may reference images not displayed]

FINDINGS: Normal bowel gas pattern.  No kidney stone identified.
No focal bony abnormality.
IMPRESSION: Negative

## 2013-06-17 ENCOUNTER — Ambulatory Visit (INDEPENDENT_AMBULATORY_CARE_PROVIDER_SITE_OTHER): Payer: 59 | Admitting: Family Medicine

## 2013-06-17 ENCOUNTER — Encounter: Payer: Self-pay | Admitting: Family Medicine

## 2013-06-17 VITALS — BP 136/79 | HR 105 | Temp 101.5°F | Resp 16 | Wt 248.0 lb

## 2013-06-17 DIAGNOSIS — J111 Influenza due to unidentified influenza virus with other respiratory manifestations: Secondary | ICD-10-CM

## 2013-06-17 DIAGNOSIS — R059 Cough, unspecified: Secondary | ICD-10-CM

## 2013-06-17 DIAGNOSIS — R509 Fever, unspecified: Secondary | ICD-10-CM

## 2013-06-17 DIAGNOSIS — R05 Cough: Secondary | ICD-10-CM

## 2013-06-17 LAB — POCT URINALYSIS DIPSTICK
Bilirubin, UA: NEGATIVE
Blood, UA: NEGATIVE
Glucose, UA: NEGATIVE
Ketones, UA: NEGATIVE
Leukocytes, UA: NEGATIVE
Nitrite, UA: NEGATIVE
Protein, UA: NEGATIVE
Spec Grav, UA: 1.02
Urobilinogen, UA: NEGATIVE
pH, UA: 5

## 2013-06-17 LAB — POCT INFLUENZA A/B
Influenza A, POC: NEGATIVE
Influenza B, POC: NEGATIVE

## 2013-06-17 MED ORDER — IBUPROFEN 800 MG PO TABS: 800.0000 mg | ORAL_TABLET | Freq: Three times a day (TID) | ORAL | Status: AC | PRN

## 2013-06-17 MED ORDER — OSELTAMIVIR PHOSPHATE 75 MG PO CAPS: 75.0000 mg | ORAL_CAPSULE | Freq: Two times a day (BID) | ORAL | Status: AC

## 2013-06-17 MED ORDER — AZITHROMYCIN 500 MG PO TABS: 500.0000 mg | ORAL_TABLET | Freq: Every day | ORAL | Status: AC

## 2013-06-17 NOTE — Patient Instructions (Signed)
1)  Viral Illness - You could very well have influenza based on your symptoms so we are going to treat accordingly.  Take Ibuprofen 800 mg plus Tylenol 1000 mg 3 x per day.  Take Mucinex DM with Tessalon Perles +/- Tussionex.  Remain out of work until you have gone 48 hrs without a fever off of fever reducing medications.  If by the end of the week, you are feeling worse and not better then start on the Zithromax.      Influenza, Adult Influenza ("the flu") is a viral infection of the respiratory tract. It occurs more often in winter months because people spend more time in close contact with one another. Influenza can make you feel very sick. Influenza easily spreads from person to person (contagious). CAUSES  Influenza is caused by a virus that infects the respiratory tract. You can catch the virus by breathing in droplets from an infected person's cough or sneeze. You can also catch the virus by touching something that was recently contaminated with the virus and then touching your mouth, nose, or eyes. SYMPTOMS  Symptoms typically last 4 to 10 days and may include:  Fever.  Chills.  Headache, body aches, and muscle aches.  Sore throat.  Chest discomfort and cough.  Poor appetite.  Weakness or feeling tired.  Dizziness.  Nausea or vomiting. DIAGNOSIS  Diagnosis of influenza is often made based on your history and a physical exam. A nose or throat swab test can be done to confirm the diagnosis. RISKS AND COMPLICATIONS You may be at risk for a more severe case of influenza if you smoke cigarettes, have diabetes, have chronic heart disease (such as heart failure) or lung disease (such as asthma), or if you have a weakened immune system. Elderly people and pregnant women are also at risk for more serious infections. The most common complication of influenza is a lung infection (pneumonia). Sometimes, this complication can require emergency medical care and may be  life-threatening. PREVENTION  An annual influenza vaccination (flu shot) is the best way to avoid getting influenza. An annual flu shot is now routinely recommended for all adults in the [place] TREATMENT  In mild cases, influenza goes away on its own. Treatment is directed at relieving symptoms. For more severe cases, your caregiver may prescribe antiviral medicines to shorten the sickness. Antibiotic medicines are not effective, because the infection is caused by a virus, not by bacteria. HOME CARE INSTRUCTIONS  Only take over-the-counter or prescription medicines for pain, discomfort, or fever as directed by your caregiver.  Use a cool mist humidifier to make breathing easier.  Get plenty of rest until your temperature returns to normal. This usually takes 3 to 4 days.  Drink enough fluids to keep your urine clear or pale yellow.  Cover your mouth and nose when coughing or sneezing, and wash your hands well to avoid spreading the virus.  Stay home from work or school until your fever has been gone for at least 1 full day. SEEK MEDICAL CARE IF:   You have chest pain or a deep cough that worsens or produces more mucus.  You have nausea, vomiting, or diarrhea. SEEK IMMEDIATE MEDICAL CARE IF:   You have difficulty breathing, shortness of breath, or your skin or nails turn bluish.  You have severe neck pain or stiffness.  You have a severe headache, facial pain, or earache.  You have a worsening or recurring fever.  You have nausea or vomiting that cannot be  controlled. MAKE SURE YOU:  Understand these instructions.  Will watch your condition.  Will get help right away if you are not doing well or get worse. Document Released: 05/19/2000 Document Revised: 11/21/2011 Document Reviewed: 08/21/2011 [facility] Patient Information 2014 [place], [place].

## 2013-06-17 NOTE — Progress Notes (Signed)
Subjective:    Patient ID: Dean Riley, male    DOB: 11-12-1975, 38 y.o.   MRN: [id number]  Dean Riley is here today with a fever, URI symptoms and burning with urination.  His symptoms seem to have come on suddenly yesterday.      Fever  This is a new problem. Dean current episode started yesterday. Dean maximum temperature noted was 101 to 101.9 F. Associated symptoms include coughing, headaches and muscle aches. Associated symptoms comments: He took these medications a few minutes prior his appointment.. He has tried NSAIDs and acetaminophen for Dean symptoms. Dean treatment provided no relief.     Review of Systems  Constitutional: Positive for fever and chills.  HENT: Positive for rhinorrhea and sneezing.   Respiratory: Positive for cough.   Genitourinary:       Burning with urination   Neurological: Positive for headaches.     Past Medical History  Diagnosis Date  . Hypertension   . Hyperlipidemia   . Gout   . Vitamin D deficiency      History   Social History Narrative   Marital Status: Married Scientific laboratory technician)   Children:  Dean Riley, Dean Riley, Dean Riley and Dean Riley   Pets: None   Living Situation: Lives with wife, Dean Riley and Dean Riley   Occupation: Merchandiser, retail at [employer]   Education: GED   Tobacco Use/Exposure:  None    Alcohol Use:  Occasional   Drug Use:  None   Diet:  Regular   Exercise:  None     Family History  Problem Relation Age of Onset  . Diabetes Father   . Heart attack Father   . Hyperlipidemia Father   . Hypertension Father   . Sudden death Neg Hx      Current Outpatient Prescriptions on File Prior to Visit  Medication Sig Dispense Refill  . fenofibrate (TRICOR) 145 MG tablet Take 1 tablet (145 mg total) by mouth daily.  90 tablet  3  . fish oil-omega-3 fatty acids 1000 MG capsule Take 1 g by mouth daily.      Marland Kitchen losartan (COZAAR) 50 MG tablet Take 1 tablet (50 mg total) by mouth daily.  90 tablet  3  . MILK THISTLE-DAND-FENNEL-LICOR PO Take 240 mg  by mouth daily.      . vitamin E 400 UNIT capsule Take 400 Units by mouth daily.      . chlorpheniramine-HYDROcodone (TUSSIONEX PENNKINETIC ER) 10-8 MG/5ML LQCR Take 5 mLs by mouth every 12 (twelve) hours as needed.  240 mL  0   No current facility-administered medications on file prior to visit.     Allergies  Allergen Reactions  . Indomethacin   . Lisinopril      Immunization History  Administered Date(s) Administered  . Influenza-Unspecified 03/19/2013       Objective:   Physical Exam  Vitals reviewed. Constitutional: He appears well-nourished. He is cooperative. He appears ill.  HENT:  Mouth/Throat: No oropharyngeal exudate.  Eyes: Conjunctivae are normal.  Neck: Neck supple.  Cardiovascular: Normal rate, regular rhythm and normal heart sounds.   Pulmonary/Chest: Effort normal and breath sounds normal. No respiratory distress. He has no wheezes. He has no rales.  Lymphadenopathy:    He has no cervical adenopathy.  Neurological: He is alert.      Assessment & Plan:    Dean Riley was seen today for fever.  Diagnoses and associated orders for this visit:  Fever, unspecified - POCT urinalysis dipstick (Normal)  - POCT Influenza A/B (Negative  for A/B)  - ibuprofen (ADVIL,MOTRIN) 800 MG tablet; Take 1 tablet (800 mg total) by mouth every 8 (eight) hours as needed.  Influenza with other respiratory manifestations  His symptoms are consistent with influenza.  He could be positive for H2N3 so we'll treat him for Dean flu.   - oseltamivir (TAMIFLU) 75 MG capsule; Take 1 capsule (75 mg total) by mouth 2 (two) times daily.  Cough - azithromycin (ZITHROMAX) 500 MG tablet; Take 1 tablet (500 mg total) by mouth daily. Take 1 tablet daily for 3 days.

## 2013-07-14 ENCOUNTER — Other Ambulatory Visit: Payer: Self-pay | Admitting: Family Medicine

## 2013-07-15 ENCOUNTER — Other Ambulatory Visit: Payer: Self-pay | Admitting: Family Medicine

## 2013-07-15 DIAGNOSIS — R059 Cough, unspecified: Secondary | ICD-10-CM

## 2013-07-15 DIAGNOSIS — R05 Cough: Secondary | ICD-10-CM

## 2013-07-15 MED ORDER — BENZONATATE 200 MG PO CAPS: 200.0000 mg | ORAL_CAPSULE | Freq: Three times a day (TID) | ORAL | Status: AC | PRN

## 2013-12-22 ENCOUNTER — Other Ambulatory Visit: Payer: Self-pay | Admitting: Family Medicine

## 2016-09-26 ENCOUNTER — Encounter (HOSPITAL_BASED_OUTPATIENT_CLINIC_OR_DEPARTMENT_OTHER): Payer: Self-pay

## 2016-09-26 ENCOUNTER — Emergency Department (HOSPITAL_BASED_OUTPATIENT_CLINIC_OR_DEPARTMENT_OTHER)
Admission: EM | Admit: 2016-09-26 | Discharge: 2016-09-26 | Disposition: A | Discharge status: Home / Self Care | Payer: Managed Care, Other (non HMO) | Attending: Emergency Medicine | Admitting: Emergency Medicine

## 2016-09-26 ENCOUNTER — Emergency Department (HOSPITAL_BASED_OUTPATIENT_CLINIC_OR_DEPARTMENT_OTHER): Payer: Managed Care, Other (non HMO)

## 2016-09-26 DIAGNOSIS — Z79899 Other long term (current) drug therapy: Secondary | ICD-10-CM | POA: Insufficient documentation

## 2016-09-26 DIAGNOSIS — M25521 Pain in right elbow: Secondary | ICD-10-CM | POA: Diagnosis present

## 2016-09-26 DIAGNOSIS — M10421 Other secondary gout, right elbow: Secondary | ICD-10-CM | POA: Diagnosis not present

## 2016-09-26 DIAGNOSIS — M778 Other enthesopathies, not elsewhere classified: Secondary | ICD-10-CM | POA: Insufficient documentation

## 2016-09-26 DIAGNOSIS — M779 Enthesopathy, unspecified: Secondary | ICD-10-CM

## 2016-09-26 DIAGNOSIS — I10 Essential (primary) hypertension: Secondary | ICD-10-CM | POA: Diagnosis not present

## 2016-09-26 MED ORDER — HYDROCODONE-ACETAMINOPHEN 5-325 MG PO TABS: 2.0000 | ORAL_TABLET | ORAL | 0 refills | Status: AC | PRN

## 2016-09-26 MED ORDER — NAPROXEN 500 MG PO TABS: 500.0000 mg | ORAL_TABLET | Freq: Two times a day (BID) | ORAL | 0 refills | Status: AC

## 2016-09-26 MED FILL — HYDROCODON-APAP 5-325: 5-325 | 2 days supply | Qty: 10 | Fill #0

## 2016-09-26 MED FILL — NAPROXEN 500 MG TABLET: 500 | 15 days supply | Qty: 30 | Fill #0

## 2016-09-26 NOTE — ED Provider Notes (Signed)
[facility] Note   CSN: [id number] Arrival date & time: 09/26/16  8657     History   Chief Complaint Chief Complaint  Patient presents with  . Elbow Pain    HPI [name] is a 41 y.o. male. CC:  Right elbow pain  HPI:  Patient plans a pain in his right elbow. 2 days ago he extended his right arm against resistance to stop a sliding Hyla boxes. He felt some discomfort initially. This progressed. It is painful point that he feels any pain with attempted extension of his elbow and is tender to palpate over the elbow. It is not red, but looks and feels swollen to him. He has a history of gout. He takes: Colcrys. He has never had a gout attack in his elbow. Has never had olecranon bursitis  Past Medical History:  Diagnosis Date  . Gout   . Hyperlipidemia   . Hypertension   . Vitamin D deficiency     Patient Active Problem List   Diagnosis Date Noted  . Acute upper respiratory infections of unspecified site 06/09/2013  . Otalgia of both ears 06/09/2013  . Elevated LFTs 02/08/2013  . Essential hypertension, benign 02/08/2013  . Rib pain on right side 01/11/2013  . Vitamin D deficiency 06/16/2012  . Gout 02/22/2012  . Other and unspecified hyperlipidemia 11/12/2011  . Bilateral knee pain 10/20/2011    Past Surgical History:  Procedure Laterality Date  . TONSILLECTOMY         Home Medications    Prior to Admission medications   Medication Sig Start Date End Date Taking? Authorizing Provider  fish oil-omega-3 fatty acids 1000 MG capsule Take 1 g by mouth daily.   Yes Historical Provider, MD  ibuprofen (ADVIL,MOTRIN) 800 MG tablet Take 1 tablet (800 mg total) by mouth every 8 (eight) hours as needed. 06/17/13  Yes Gillian Scarce, MD  losartan (COZAAR) 50 MG tablet Take 1 tablet (50 mg total) by mouth daily. 12/27/12 09/26/16 Yes Gillian Scarce, MD  Simvastatin (ZOCOR PO) Take by mouth.   Yes Historical Provider, MD  vitamin E 400 UNIT capsule Take  400 Units by mouth daily.   Yes Historical Provider, MD  chlorpheniramine-HYDROcodone (TUSSIONEX PENNKINETIC ER) 10-8 MG/5ML LQCR Take 5 mLs by mouth every 12 (twelve) hours as needed. 05/16/13   Gillian Scarce, MD  fenofibrate (TRICOR) 145 MG tablet Take 1 tablet (145 mg total) by mouth daily. 12/27/12 12/27/13  Gillian Scarce, MD  HYDROcodone-acetaminophen (NORCO/VICODIN) 5-325 MG tablet Take 2 tablets by mouth every 4 (four) hours as needed. 09/26/16   Rolland Porter, MD  MILK THISTLE-DAND-FENNEL-LICOR PO Take 240 mg by mouth daily.    Historical Provider, MD  naproxen (NAPROSYN) 500 MG tablet Take 1 tablet (500 mg total) by mouth 2 (two) times daily. 09/26/16   Rolland Porter, MD    Family History Family History  Problem Relation Age of Onset  . Diabetes Father   . Heart attack Father   . Hyperlipidemia Father   . Hypertension Father   . Sudden death Neg Hx     Social History Social History  Substance Use Topics  . Smoking status: Never Smoker  . Smokeless tobacco: Never Used  . Alcohol use Yes     Comment: few beers on the weekend     Allergies   Indomethacin and Lisinopril   Review of Systems Review of Systems  Constitutional: Negative for appetite change, chills, diaphoresis, fatigue and  fever.  HENT: Negative for mouth sores, sore throat and trouble swallowing.   Eyes: Negative for visual disturbance.  Respiratory: Negative for cough, chest tightness, shortness of breath and wheezing.   Cardiovascular: Negative for chest pain.  Gastrointestinal: Negative for abdominal distention, abdominal pain, diarrhea, nausea and vomiting.  Endocrine: Negative for polydipsia, polyphagia and polyuria.  Genitourinary: Negative for dysuria, frequency and hematuria.  Musculoskeletal: Positive for arthralgias. Negative for gait problem.       Right elbow pain at olecranon, and medial/lateral to olecranon  Skin: Negative for color change, pallor and rash.  Neurological: Negative for dizziness,  syncope, light-headedness and headaches.  Hematological: Does not bruise/bleed easily.  Psychiatric/Behavioral: Negative for behavioral problems and confusion.     Physical Exam Updated Vital Signs BP 135/89 (BP Location: Left Arm)   Pulse 96   Temp 98.4 F (36.9 C) (Oral)   Resp 16   SpO2 100%   Physical Exam  Musculoskeletal:  TTP at olecranon and just medial and lateral. Boggy soft tissue swelling proximal to the olecranon. No palpable distention of the olecranon bursa. Not erythematous. Not warm to the touch. He is able to extend the elbow against resistance although it is quite painful to him. There is no palpable separation of triceps tendon from olecranon.     ED Treatments / Results  Labs (all labs ordered are listed, but only abnormal results are displayed) Labs Reviewed - No data to display  EKG  EKG Interpretation None       Radiology Dg Elbow Complete Right  Result Date: 09/26/2016 CLINICAL DATA:  Right elbow pain and tenderness. No known injury. History of gout. EXAM: RIGHT ELBOW - COMPLETE 3+ VIEW COMPARISON:  None. FINDINGS: Atypical positioning due to patient condition. Soft tissue swelling with elbow joint effusion. No fracture deformity or malalignment noted. No erosive changes or soft tissue calcification. IMPRESSION: 1. Soft tissue swelling and joint effusion without acute osseous finding. 2. History of gout.  No erosions or calcified tophus noted. Electronically Signed   By: Marnee Spring M.D.   On: 09/26/2016 07:38    Procedures Procedures (including critical care time)  Medications Ordered in ED Medications - No data to display   Initial Impression / Assessment and Plan / ED Course  I have reviewed the triage vital signs and the nursing notes.  Pertinent labs & imaging results that were available during my care of the patient were reviewed by me and considered in my medical decision making (see chart for details).  Differential diagnosis  includes triceps tendinitis. Partial triceps tendon tear. Avulsion of olecranon prominence. Olecranon bursitis. Gouty olecranon bursitis.    Final Clinical Impressions(s) / ED Diagnoses   Final diagnoses:  Other secondary acute gout of right elbow  Triceps tendonitis    X-ray show no bony abnormality, i.e. no olecranon avulsion. There is soft tissue swelling and joint effusion noted. Patient is afebrile. No erythema. History of gout. Plan naproxen, Vicodin, restart Colcrys.  Primary care follow-up  New Prescriptions New Prescriptions   HYDROCODONE-ACETAMINOPHEN (NORCO/VICODIN) 5-325 MG TABLET    Take 2 tablets by mouth every 4 (four) hours as needed.   NAPROXEN (NAPROSYN) 500 MG TABLET    Take 1 tablet (500 mg total) by mouth 2 (two) times daily.     Rolland Porter, MD 09/26/16 [phone]

## 2016-09-26 NOTE — Discharge Instructions (Signed)
Limit use of your right arm until symptoms improve. Follow-up with your primary care physician if not improving. Restart your Colcrys at usual dosage

## 2016-09-26 NOTE — ED Triage Notes (Signed)
Pt reports right elbow pain, swelling, tenderness upon since yesterday morning - denies known injury.

## 2020-01-17 ENCOUNTER — Telehealth: Payer: Self-pay | Admitting: Unknown Physician Specialty

## 2020-01-17 ENCOUNTER — Other Ambulatory Visit: Payer: Self-pay | Admitting: Unknown Physician Specialty

## 2020-01-17 DIAGNOSIS — I10 Essential (primary) hypertension: Secondary | ICD-10-CM

## 2020-01-17 DIAGNOSIS — U071 COVID-19: Secondary | ICD-10-CM

## 2020-01-17 MED ORDER — SODIUM CHLORIDE 0.9 % IV SOLN
1200.0000 mg | Freq: Once | INTRAVENOUS | Status: AC
  Administered 2020-01-18: 1200 mg via INTRAVENOUS
  Filled 2020-01-17: qty 1200

## 2020-01-17 NOTE — Telephone Encounter (Signed)
I connected by phone with Dean Riley on 01/17/2020 at 8:55 AM to discuss the potential use of a new treatment for mild to moderate COVID-19 viral infection in non-hospitalized patients.  This patient is a 44 y.o. male that meets the FDA criteria for Emergency Use Authorization of COVID monoclonal antibody casirivimab/imdevimab.  Has a (+) direct SARS-CoV-2 viral test result  Has mild or moderate COVID-19   Is NOT hospitalized due to COVID-19  Is within 10 days of symptom onset  Has at least one of the high risk factor(s) for progression to severe COVID-19 and/or hospitalization as defined in EUA.  Specific high risk criteria : BMI > 25 and Cardiovascular disease or hypertension   I have spoken and communicated the following to the patient or parent/caregiver regarding COVID monoclonal antibody treatment:  1. FDA has authorized the emergency use for the treatment of mild to moderate COVID-19 in adults and pediatric patients with positive results of direct SARS-CoV-2 viral testing who are 23 years of age and older weighing at least 40 kg, and who are at high risk for progressing to severe COVID-19 and/or hospitalization.  2. The significant known and potential risks and benefits of COVID monoclonal antibody, and the extent to which such potential risks and benefits are unknown.  3. Information on available alternative treatments and the risks and benefits of those alternatives, including clinical trials.  4. Patients treated with COVID monoclonal antibody should continue to self-isolate and use infection control measures (e.g., wear mask, isolate, social distance, avoid sharing personal items, clean and disinfect high touch surfaces, and frequent handwashing) according to CDC guidelines.   5. The patient or parent/caregiver has the option to accept or refuse COVID monoclonal antibody treatment.  After reviewing this information with the patient, The patient agreed to proceed with  receiving casirivimab\imdevimab infusion and will be provided a copy of the Fact sheet prior to receiving the infusion. Dean Riley 01/17/2020 8:55 AM

## 2020-01-18 ENCOUNTER — Ambulatory Visit (HOSPITAL_COMMUNITY)
Admission: RE | Admit: 2020-01-18 | Discharge: 2020-01-18 | Disposition: A | Discharge status: Home / Self Care | Payer: Managed Care, Other (non HMO) | Source: Ambulatory Visit | Attending: Pulmonary Disease | Admitting: Pulmonary Disease

## 2020-01-18 DIAGNOSIS — U071 COVID-19: Secondary | ICD-10-CM | POA: Diagnosis present

## 2020-01-18 DIAGNOSIS — I10 Essential (primary) hypertension: Secondary | ICD-10-CM | POA: Diagnosis not present

## 2020-01-18 MED ORDER — FAMOTIDINE IN NACL 20-0.9 MG/50ML-% IV SOLN: 20.0000 mg | Freq: Once | INTRAVENOUS | Status: DC | PRN

## 2020-01-18 MED ORDER — SODIUM CHLORIDE 0.9 % IV SOLN: INTRAVENOUS | Status: DC | PRN

## 2020-01-18 MED ORDER — DIPHENHYDRAMINE HCL 50 MG/ML IJ SOLN: 50.0000 mg | Freq: Once | INTRAMUSCULAR | Status: DC | PRN

## 2020-01-18 MED ORDER — METHYLPREDNISOLONE SODIUM SUCC 125 MG IJ SOLR: 125.0000 mg | Freq: Once | INTRAMUSCULAR | Status: DC | PRN

## 2020-01-18 MED ORDER — ALBUTEROL SULFATE HFA 108 (90 BASE) MCG/ACT IN AERS: 2.0000 | INHALATION_SPRAY | Freq: Once | RESPIRATORY_TRACT | Status: DC | PRN

## 2020-01-18 MED ORDER — EPINEPHRINE 0.3 MG/0.3ML IJ SOAJ: 0.3000 mg | Freq: Once | INTRAMUSCULAR | Status: DC | PRN

## 2020-01-18 NOTE — Progress Notes (Signed)
  Diagnosis: COVID-19  Physician:Dr Wright  Procedure: [facility] Med: casirivimab\imdevimab infusion - Provided patient with casirivimab\imdevimab fact sheet for patients, parents and caregivers prior to infusion.  Complications: No immediate complications noted.  Discharge: Discharged home   Dean Riley 01/18/2020  

## 2020-01-18 NOTE — Discharge Instructions (Signed)
